# Patient Record
Sex: Female | Born: 1980 | Race: White | Hispanic: No | Marital: Married | Smoking: Former smoker
Health system: Southern US, Community
[De-identification: ages and names within clinical notes are randomized; demographics above are authoritative.]

## PROBLEM LIST (undated history)

## (undated) DIAGNOSIS — IMO0002 Reserved for concepts with insufficient information to code with codable children: Secondary | ICD-10-CM

## (undated) DIAGNOSIS — S6990XA Unspecified injury of unspecified wrist, hand and finger(s), initial encounter: Secondary | ICD-10-CM

---

## 2000-07-18 ENCOUNTER — Encounter: Payer: Self-pay | Admitting: Emergency Medicine

## 2000-07-18 ENCOUNTER — Emergency Department (HOSPITAL_COMMUNITY): Admission: EM | Admit: 2000-07-18 | Discharge: 2000-07-18 | Payer: Self-pay

## 2001-03-15 ENCOUNTER — Emergency Department (HOSPITAL_COMMUNITY): Admission: EM | Admit: 2001-03-15 | Discharge: 2001-03-16 | Payer: Self-pay | Admitting: Emergency Medicine

## 2001-06-09 ENCOUNTER — Emergency Department (HOSPITAL_COMMUNITY): Admission: EM | Admit: 2001-06-09 | Discharge: 2001-06-10 | Payer: Self-pay | Admitting: Emergency Medicine

## 2001-06-25 ENCOUNTER — Emergency Department (HOSPITAL_COMMUNITY): Admission: EM | Admit: 2001-06-25 | Discharge: 2001-06-25 | Payer: Self-pay

## 2003-11-05 ENCOUNTER — Emergency Department (HOSPITAL_COMMUNITY): Admission: EM | Admit: 2003-11-05 | Discharge: 2003-11-06 | Payer: Self-pay | Admitting: Emergency Medicine

## 2003-11-08 ENCOUNTER — Emergency Department (HOSPITAL_COMMUNITY): Admission: EM | Admit: 2003-11-08 | Discharge: 2003-11-08 | Payer: Self-pay | Admitting: Emergency Medicine

## 2005-09-08 ENCOUNTER — Emergency Department (HOSPITAL_COMMUNITY): Admission: EM | Admit: 2005-09-08 | Discharge: 2005-09-08 | Payer: Self-pay | Admitting: Emergency Medicine

## 2007-03-08 ENCOUNTER — Emergency Department (HOSPITAL_COMMUNITY): Admission: EM | Admit: 2007-03-08 | Discharge: 2007-03-08 | Payer: Self-pay | Admitting: Emergency Medicine

## 2008-04-30 ENCOUNTER — Emergency Department (HOSPITAL_COMMUNITY): Admission: EM | Admit: 2008-04-30 | Discharge: 2008-04-30 | Payer: Self-pay | Admitting: Emergency Medicine

## 2009-10-02 IMAGING — CR DG WRIST COMPLETE 3+V*R*
4 series · 4 of 4 positions shown · non-contrast
Comparison: None.

CLINICAL DATA: 26-year-old female.  Hand injury.  Status post fall.
Unable to move wrist or fingers.

RIGHT WRIST - COMPLETE 3+ VIEW

[x wrist pa right]
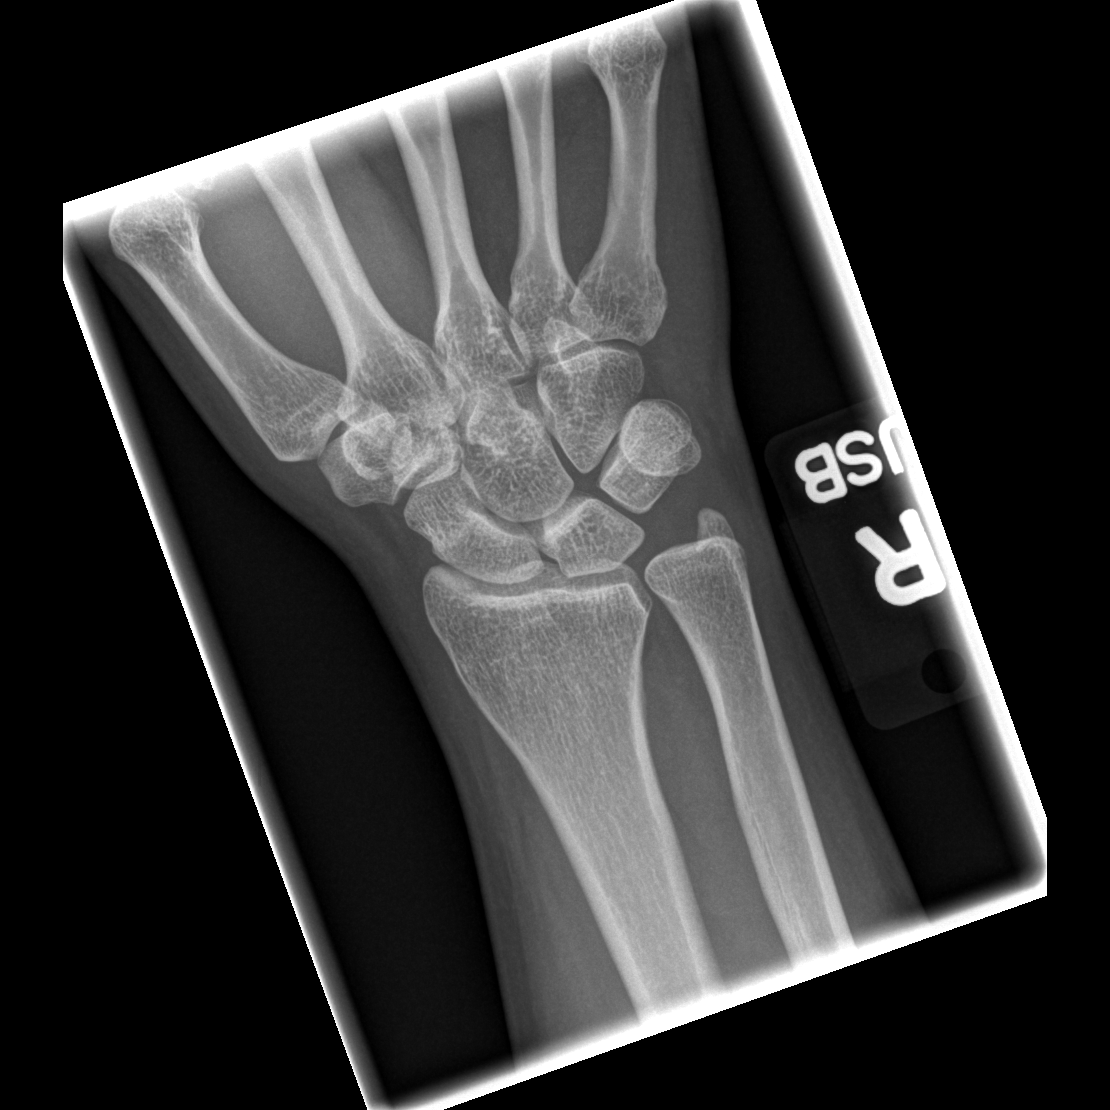

[x wrist obl right]
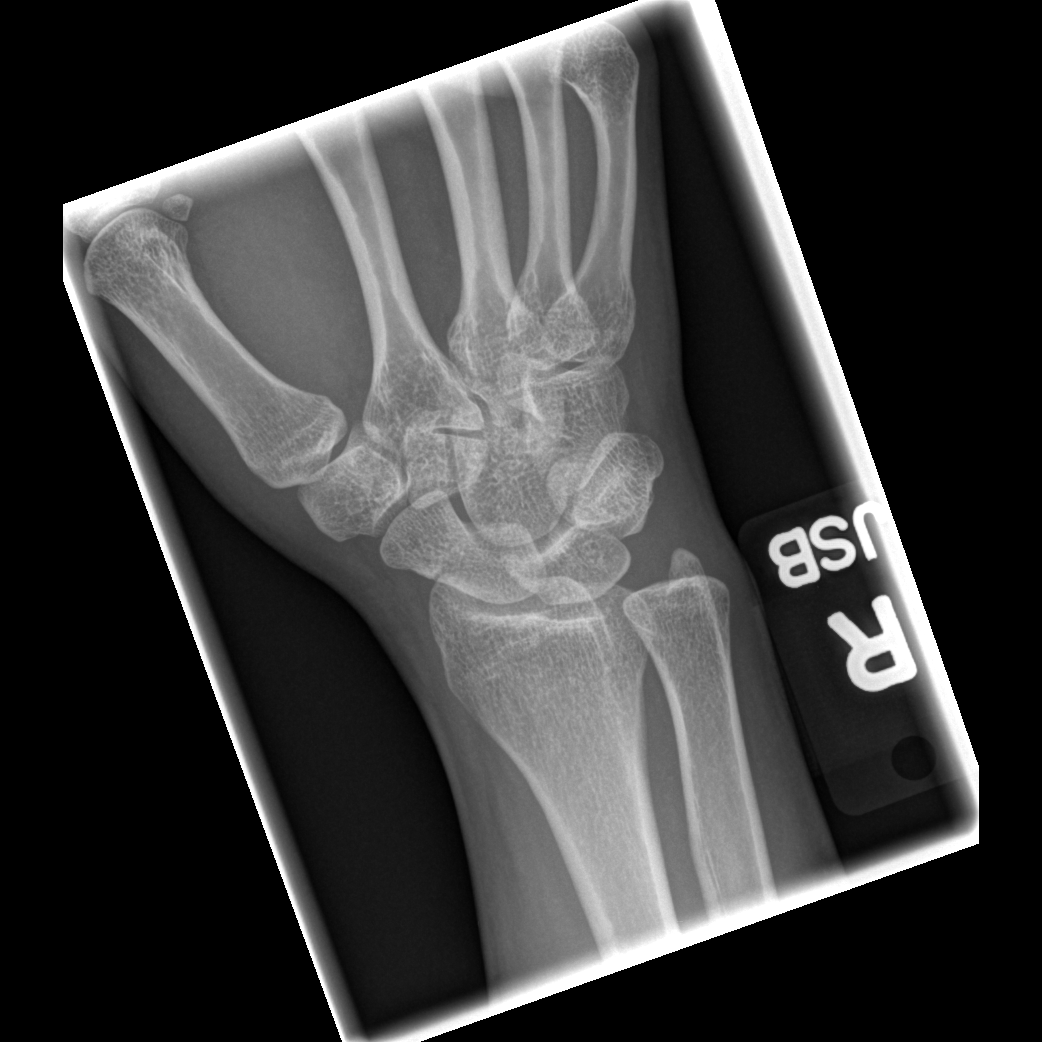

[x wrist lat right]
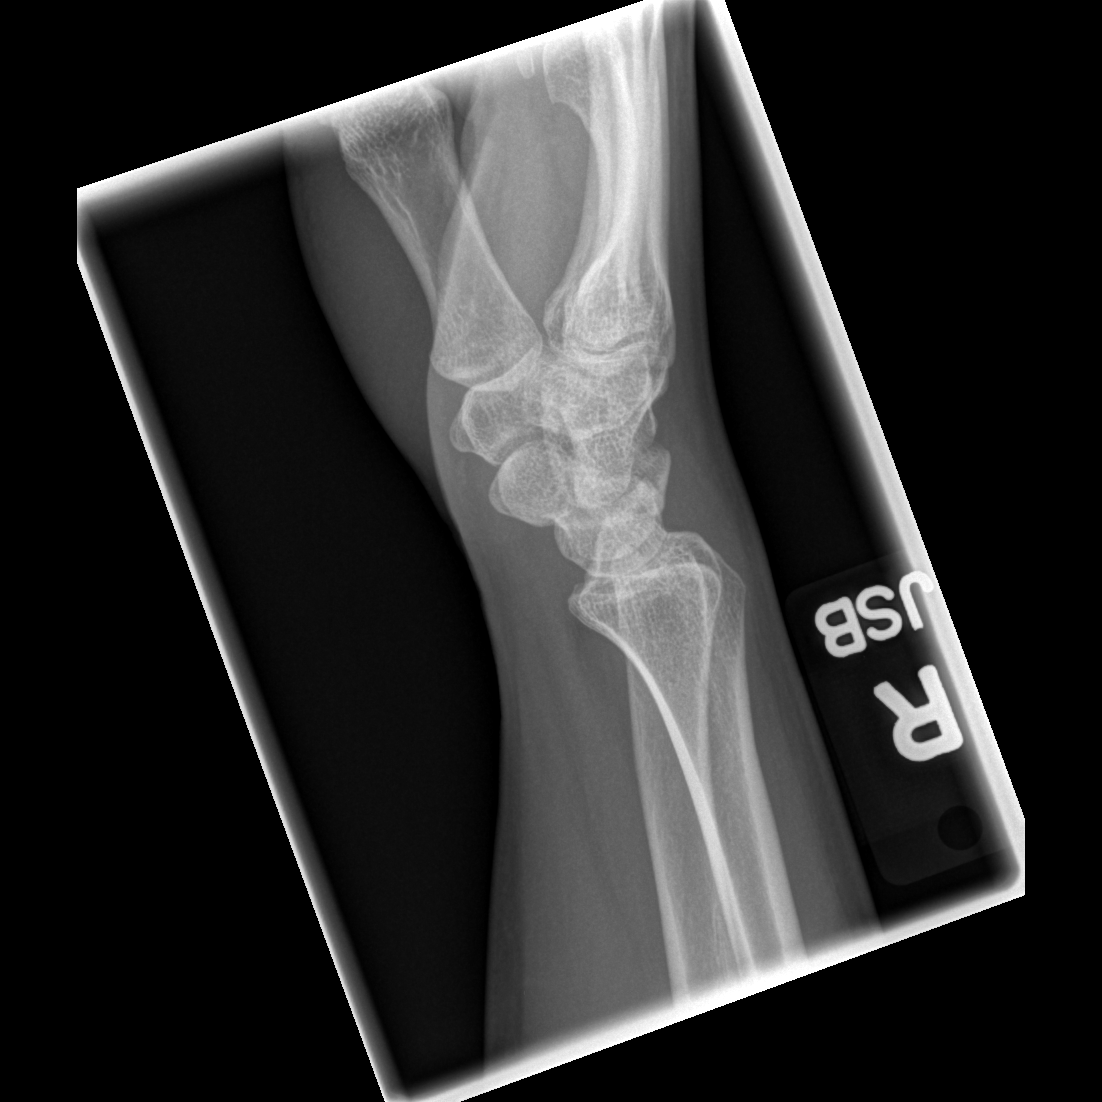

[x navicular]
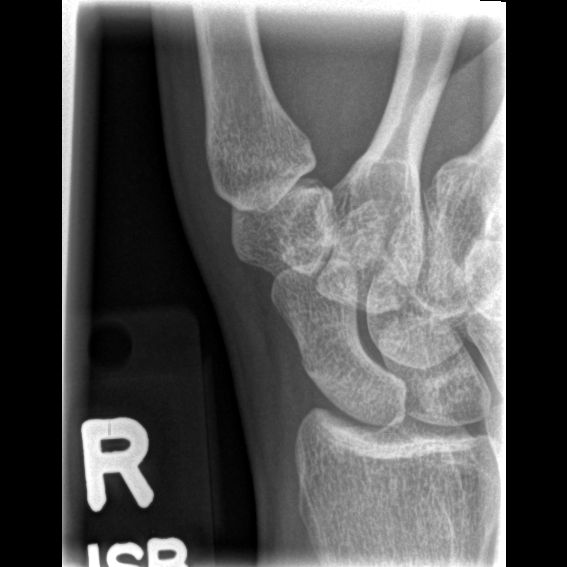

[4 of 4 positions shown; findings below may reference images not displayed]

FINDINGS: Four views of the right wrist demonstrate no acute bone
or soft tissue abnormality.  The joints are located.
IMPRESSION: 1.  No acute abnormality.

## 2011-04-20 NOTE — Consult Note (Signed)
NAME:  Joy Petersen              ACCOUNT NO.:  [id number]   MEDICAL RECORD NO.:  [id number]          PATIENT TYPE:  EMS   LOCATION:  [facility]                         FACILITY:  [facility]   PHYSICIAN:  Dionne Ano. Gramig III, M.D.DATE OF BIRTH:  [birth year]   DATE OF CONSULTATION:  DATE OF DISCHARGE:  04/30/2008                                 CONSULTATION   HISTORY OF PRESENT ILLNESS:  Joy Petersen is a 30 year old female who  complains of inability to move her right upper extremity.  The patient  has a significant history and this weekend she was out drinking and  subsequently woke up the next morning after [place] that [place]  [place] with inability to move her fingers or wrist.  Since that time, the  patient has been able flex the fingers.   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  She notes no excruciating pain but has difficulty certainly  with extension of the fingers and wrist.   PAST MEDICAL HISTORY:  Reviewed as above.  Seizure disorder.   PAST SURGICAL HISTORY:  Reviewed.   CURRENT MEDICATIONS:  Methadone and occasional antiinflammatories.   SOCIAL HISTORY:  She has a history of smoking.  Occasional binge drinker  and marijuana use.   FAMILY HISTORY:  CAD, Cancer, diabetes, and hypertension.   PHYSICAL EXAMINATION:  GENERAL:  She is a pleasant female, not in acute  distress.  VITAL SIGNS:  Stable.  EXTREMITIES:  On upper extremity exam, landmarks are normal.  There was  no gross instability, locking, popping, or catching.  She has normal  vascular examination.  No signs of compartment syndrome, dystrophic  reaction, or infection.  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  She complains of wrist pain and does have focal tenderness  over the mid carpal and radial carpal joints; however, there is no gross  instability on scaphoid shift.   TESTS:  X-rays of the wrist were negative.   IMPRESSION:  Radial nerve  palsy.   PLAN:  I have discussed with the patient in often times, patients will,  in a stupered state, put too much pressure on the radial nerve causing a  neuropractic injury.  It is difficult to tell whether this is a  neurapraxia or something more significant but given the continuous of  injury that was said this typically will heal with observation.  I have  recommended cock-up wrist splint, general passive range of motion to the  fingers, and return to the office to see me in 2-3 weeks.  We are going  to place her on antiinflammatories only.  She will continue her  methadone treatment as an outpatient.  I have discussed with her at  length the issues of a Saturday night palsy and our treatment for.  If  she  does not improve within 2 months, I will plan for EMG nerve conduction  studies, but otherwise, we will simply watch out, clinically I suspect  this will improve up.  Cautioned her against smoking and asked her to  decrease  her smoking of course.  It was a pleasure to see her today.      Dionne Ano. Everlene Other, M.D.     Nash Mantis  D:  04/30/2008  T:  05/01/2008  Job:  [id number]

## 2014-08-01 ENCOUNTER — Emergency Department (HOSPITAL_COMMUNITY)
Admission: EM | Admit: 2014-08-01 | Discharge: 2014-08-01 | Disposition: A | Discharge status: Home / Self Care | Payer: Medicaid - Out of State | Attending: Emergency Medicine | Admitting: Emergency Medicine

## 2014-08-01 ENCOUNTER — Encounter (HOSPITAL_COMMUNITY): Payer: Self-pay | Admitting: Emergency Medicine

## 2014-08-01 DIAGNOSIS — M25539 Pain in unspecified wrist: Secondary | ICD-10-CM | POA: Insufficient documentation

## 2014-08-01 DIAGNOSIS — Z87891 Personal history of nicotine dependence: Secondary | ICD-10-CM | POA: Insufficient documentation

## 2014-08-01 DIAGNOSIS — Z8781 Personal history of (healed) traumatic fracture: Secondary | ICD-10-CM | POA: Insufficient documentation

## 2014-08-01 DIAGNOSIS — M79601 Pain in right arm: Secondary | ICD-10-CM

## 2014-08-01 HISTORY — DX: Unspecified injury of unspecified wrist, hand and finger(s), initial encounter: S69.90XA

## 2014-08-01 HISTORY — DX: Reserved for concepts with insufficient information to code with codable children: IMO0002

## 2014-08-01 MED ORDER — HYDROCODONE-ACETAMINOPHEN 5-325 MG PO TABS: 1.0000 | ORAL_TABLET | ORAL | Status: AC | PRN

## 2014-08-01 MED ORDER — HYDROCODONE-ACETAMINOPHEN 5-325 MG PO TABS
2.0000 | ORAL_TABLET | Freq: Once | ORAL | Status: AC
  Administered 2014-08-01: 2 via ORAL
  Filled 2014-08-01: qty 2

## 2014-08-01 NOTE — ED Notes (Addendum)
Pt reports she is from out of town, lives in [place]. Hx of right broken wrist, then in November re-injured right wrist. Pt was dx with "complex regional pain syndrome" and in [place] prescribed CBD oil, (cannibus oil that you eat). Pt came to visit family Aug 12th, pt was not able to bring CBD oil with her, brought norco with her. Pt is now out of norco and is in town 2 more weeks. Pt called pcp and was told to come to ED. Pt has severe right arm pain 7/10 Pt unable to hold child or do ADLs because of pain. Pt is out of norco and ibuprofen.

## 2014-08-01 NOTE — ED Provider Notes (Signed)
CSN: [id number]     Arrival date & time 08/01/14  1551 History   First MD Initiated Contact with Patient 08/01/14 1610     Chief Complaint  Patient presents with  . Arm Pain     (Consider location/radiation/quality/duration/timing/severity/associated sxs/prior Treatment) Patient is a 33 y.o. female presenting with arm pain. The history is provided by the patient and medical records.  Arm Pain Associated symptoms include arthralgias.   This is a 33 y.o. F with PMH significant for multiple right wrist injuries presenting to the ED for right wrist pain.  Patient states over the past several years she has had multiple right wrist injuries and fractures resulting in CRPS (complex regional pain syndrome).  States she has chronic intermittent numbness and paresthesias of her right arm which are unchanged from baseline. She denies any new injuries or falls. She is currently living in [place] and is enrolled in cannabis clinic in which she takes CBD oil chews for her pain management which is working well for her.  She has been in [place] visiting family for the past week and since was not allowed to fly with cannabis she has been taking norco but has since run out, last dose 4 days ago.  Patient states she has severe pain which is currently limiting her abilities to care for her daughter (ie- unable to lift her, change diapers independently, etc).  Tachycardic on arrival.  Past Medical History  Diagnosis Date  . Wrist injury     hx broken right wrist   History reviewed. No pertinent past surgical history. History reviewed. No pertinent family history. History  Substance Use Topics  . Smoking status: Former Smoker -- 15 years    Types: Cigarettes  . Smokeless tobacco: Not on file  . Alcohol Use: No   OB History   Grav Para Term Preterm Abortions TAB SAB Ect Mult Living                 Review of Systems  Musculoskeletal: Positive for arthralgias.  All other systems reviewed and are  negative.     Allergies  Review of patient's allergies indicates no known allergies.  Home Medications   Prior to Admission medications   Not on File   BP 118/83  Pulse 129  Temp(Src) 98.1 F (36.7 C) (Oral)  Resp 18  SpO2 100%  LMP 07/09/2014  Physical Exam  Nursing note and vitals reviewed. Constitutional: She is oriented to person, place, and time. She appears well-developed and well-nourished. No distress.  HENT:  Head: Normocephalic and atraumatic.  Mouth/Throat: Oropharynx is clear and moist.  Eyes: Conjunctivae and EOM are normal. Pupils are equal, round, and reactive to light.  Neck: Normal range of motion. Neck supple.  Cardiovascular: Normal rate, regular rhythm and normal heart sounds.   Pulmonary/Chest: Effort normal and breath sounds normal. No respiratory distress. She has no wheezes.  Musculoskeletal: Normal range of motion.  Right arm without swelling or gross deformities; full ROM of shoulder, elbow, and wrist but some pain when doing so; moving all fingers appropriately; arm remains NVI; sensation deficit in radial nerve distribution when compared with left which is her baseline  Neurological: She is alert and oriented to person, place, and time.  Skin: Skin is warm and dry. She is not diaphoretic.  Psychiatric: She has a normal mood and affect.    ED Course  Procedures (including critical care time) Labs Review Labs Reviewed - No data to display  Imaging Review No results  found.   EKG Interpretation None      MDM   Final diagnoses:  Right arm pain   33 y.o. F with increased pain of right arm without new injuries or trauma.  Arm remains NVI to her baseline.  Have given dose of pain meds in ED and splinted wrist.   Short supply of norco until returns home and can FU with her PCP.  Discussed plan with patient, he/she acknowledged understanding and agreed with plan of care.  Return precautions given for new or worsening symptoms.   Tachycardia  on arrival was likely due to pain, has resolved prior to discharge.   Current HR 92.  Garlon Hatchet, PA-C 08/01/14 2009

## 2014-08-01 NOTE — ED Provider Notes (Signed)
Medical screening examination/treatment/procedure(s) were performed by non-physician practitioner and as supervising physician I was immediately available for consultation/collaboration.   EKG Interpretation None       Juliet Rude. Rubin Payor, MD 08/01/14 [phone]

## 2014-08-01 NOTE — Discharge Instructions (Signed)
Take the prescribed medication as directed. Follow-up with your primary care physician once returning home. Return to the ED for new or worsening symptoms.
